# Patient Record
Sex: Male | Born: 1940 | Race: White | Hispanic: No | Marital: Married | Smoking: Never smoker
Health system: Southern US, Community
[De-identification: ages and names within clinical notes are randomized; demographics above are authoritative.]

## PROBLEM LIST (undated history)

## (undated) DIAGNOSIS — C61 Malignant neoplasm of prostate: Secondary | ICD-10-CM

## (undated) DIAGNOSIS — E785 Hyperlipidemia, unspecified: Secondary | ICD-10-CM

## (undated) DIAGNOSIS — I2510 Atherosclerotic heart disease of native coronary artery without angina pectoris: Secondary | ICD-10-CM

## (undated) DIAGNOSIS — R1013 Epigastric pain: Secondary | ICD-10-CM

## (undated) DIAGNOSIS — I10 Essential (primary) hypertension: Secondary | ICD-10-CM

## (undated) DIAGNOSIS — I350 Nonrheumatic aortic (valve) stenosis: Secondary | ICD-10-CM

## (undated) DIAGNOSIS — M1990 Unspecified osteoarthritis, unspecified site: Secondary | ICD-10-CM

## (undated) DIAGNOSIS — K219 Gastro-esophageal reflux disease without esophagitis: Secondary | ICD-10-CM

## (undated) HISTORY — DX: Malignant neoplasm of prostate: C61

## (undated) HISTORY — PX: TRIGGER FINGER RELEASE: SHX641

## (undated) HISTORY — PX: PARATHYROIDECTOMY: SHX19

## (undated) HISTORY — DX: Gastro-esophageal reflux disease without esophagitis: K21.9

## (undated) HISTORY — DX: Hyperlipidemia, unspecified: E78.5

## (undated) HISTORY — DX: Essential (primary) hypertension: I10

## (undated) HISTORY — DX: Nonrheumatic aortic (valve) stenosis: I35.0

## (undated) HISTORY — DX: Epigastric pain: R10.13

## (undated) HISTORY — DX: Atherosclerotic heart disease of native coronary artery without angina pectoris: I25.10

## (undated) HISTORY — PX: OTHER SURGICAL HISTORY: SHX169

## (undated) HISTORY — PX: PROSTATE SURGERY: SHX751

## (undated) HISTORY — DX: Unspecified osteoarthritis, unspecified site: M19.90

## (undated) HISTORY — PX: TONSILLECTOMY: SUR1361

---

## 2011-08-13 ENCOUNTER — Encounter: Payer: Self-pay | Admitting: *Deleted

## 2011-08-14 ENCOUNTER — Ambulatory Visit (INDEPENDENT_AMBULATORY_CARE_PROVIDER_SITE_OTHER): Payer: Medicare Other | Admitting: Cardiology

## 2011-08-14 ENCOUNTER — Encounter: Payer: Self-pay | Admitting: Cardiology

## 2011-08-14 DIAGNOSIS — I10 Essential (primary) hypertension: Secondary | ICD-10-CM

## 2011-08-14 DIAGNOSIS — E785 Hyperlipidemia, unspecified: Secondary | ICD-10-CM | POA: Insufficient documentation

## 2011-08-14 DIAGNOSIS — R011 Cardiac murmur, unspecified: Secondary | ICD-10-CM

## 2011-08-14 NOTE — Assessment & Plan Note (Signed)
Blood pressure controlled. Continue present medications. 

## 2011-08-14 NOTE — Progress Notes (Signed)
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 Patient previously lived in [place]. He exercises routinely riding a recumbent bike for up to one and one half hours. He does not have dyspnea on exertion, orthopnea, PND, pedal edema, exertional chest pain or syncope. He apparently had a cardiac CT some years ago which was unremarkable. He apparently had a nuclear study one to 2 years ago that was normal. Those records are available. He also has been noted to have a murmur and cardiology was asked to evaluate.  Current Outpatient Prescriptions  Medication Sig Dispense Refill  . acetaminophen (TYLENOL) 650 MG CR tablet Take 650 mg by mouth every 8 (eight) hours as needed.      Marland Kitchen amLODipine (NORVASC) 10 MG tablet Take 10 mg by mouth daily.      Marland Kitchen aspirin 81 MG tablet Take 160 mg by mouth daily.      Marland Kitchen atorvastatin (LIPITOR) 40 MG tablet Take 40 mg by mouth daily.      . celecoxib (CELEBREX) 200 MG capsule Take 200 mg by mouth daily as needed.      . Cholecalciferol 1000 UNITS capsule Take 1,000 Units by mouth daily.      Marland Kitchen HYDROcodone-acetaminophen (LORTAB) 10-500 MG per tablet Take 1 tablet by mouth every 6 (six) hours as needed.      Marland Kitchen omeprazole (PRILOSEC) 20 MG capsule Take 20 mg by mouth daily.      . valsartan (DIOVAN) 320 MG tablet Take 320 mg by mouth daily.      Marland Kitchen zolpidem (AMBIEN) 10 MG tablet Take 10 mg by mouth at bedtime as needed.        Allergies  Allergen Reactions  . Ace Inhibitors   . Adhesive (Tape)   . Zocor (Simvastatin)     Past Medical History  Diagnosis Date  . Hypertension   . Hyperlipidemia   . Recurrent prostate adenocarcinoma   . Arthritis   . Dyspepsia   . GERD (gastroesophageal reflux disease)     Past Surgical History  Procedure Date  . Prostate surgery   . Trigger finger release   . Parathyroidectomy   . Tonsillectomy   . Appendectomy     History   Social History  . Marital Status: Married      Spouse Name: N/A    Number of Children: 2  . Years of Education: N/A   Occupational History  .      Retired   Social History Main Topics  . Smoking status: Never Smoker   . Smokeless tobacco: Never Used  . Alcohol Use: Yes     Rarely  . Drug Use: Not on file  . Sexually Active: Not on file   Other Topics Concern  . Not on file   Social History Narrative  . No narrative on file    Family History  Problem Relation Age of Onset  . Coronary artery disease Father     age 64    ROS: Problems with back pain but no fevers or chills, productive cough, hemoptysis, dysphasia, odynophagia, melena, hematochezia, dysuria, hematuria, rash, seizure activity, orthopnea, PND, pedal edema, claudication. Remaining systems are negative.  Physical Exam:   Blood pressure 108/66, pulse 55, height 5\' 10"  (1.778 m), weight 194 lb (87.998 kg).  General:  Well developed/well nourished in NAD Skin warm/dry Patient not depressed No peripheral clubbing Back-normal HEENT-normal/normal eyelids Neck supple/normal carotid upstroke bilaterally; no bruits; no JVD; no thyromegaly chest - CTA/ normal  expansion CV - RRR/normal S1 and S2; no rubs or gallops;  PMI nondisplaced; 2/6 systolic ejection murmur Abdomen -NT/ND, no HSM, no mass, + bowel sounds, no bruit 2+ femoral pulses, no bruits Ext-no edema, chords, 2+ DP Neuro-grossly nonfocal  ECG sinus bradycardia with no ST changes.

## 2011-08-14 NOTE — Assessment & Plan Note (Signed)
Probable systolic ejection murmur. Obtain records from [facility] concerning previous cardiac CT and functional study. No further evaluation at this time.

## 2011-08-14 NOTE — Patient Instructions (Signed)
Your physician wants you to follow-up in: ONE YEAR You will receive a reminder letter in the mail two months in advance. If you don't receive a letter, please call our office to schedule the follow-up appointment.  

## 2011-08-14 NOTE — Assessment & Plan Note (Signed)
Continue statin. Lipids and liver monitored by primary care. 

## 2011-08-18 ENCOUNTER — Telehealth: Payer: Self-pay | Admitting: Cardiology

## 2011-08-18 NOTE — Telephone Encounter (Addendum)
ROI faxed to [facility]/Dr.Jason Mallory Shirk @ [phone] 08/18/11/KM  Records Received from  [facility] gave to [facility] 08/18/11/KM

## 2011-08-20 ENCOUNTER — Telehealth: Payer: Self-pay | Admitting: *Deleted

## 2011-08-20 DIAGNOSIS — Z01810 Encounter for preprocedural cardiovascular examination: Secondary | ICD-10-CM

## 2011-08-20 DIAGNOSIS — I77810 Thoracic aortic ectasia: Secondary | ICD-10-CM

## 2011-08-20 NOTE — Telephone Encounter (Signed)
Spoke with pt, CTA scheduled. He has a copy of labs from his primary dated 08-10-11 with bun 29 and cr 1.30. precert made aware.

## 2011-08-20 NOTE — Telephone Encounter (Signed)
Left message for pt to call, records from [facility], [place] reviewed by dr Jens Som and pt needs a follow up CTA of the chest to follow up dilated aortic root. He will also need a bmp for that procedure.

## 2011-08-21 ENCOUNTER — Ambulatory Visit: Payer: Self-pay | Admitting: Cardiology

## 2011-08-25 ENCOUNTER — Ambulatory Visit (INDEPENDENT_AMBULATORY_CARE_PROVIDER_SITE_OTHER)
Admission: RE | Admit: 2011-08-25 | Discharge: 2011-08-25 | Disposition: A | Discharge status: Home / Self Care | Payer: Medicare Other | Source: Ambulatory Visit | Attending: Cardiology | Admitting: Cardiology

## 2011-08-25 ENCOUNTER — Telehealth: Payer: Self-pay | Admitting: Cardiology

## 2011-08-25 DIAGNOSIS — I77819 Aortic ectasia, unspecified site: Secondary | ICD-10-CM

## 2011-08-25 DIAGNOSIS — I77810 Thoracic aortic ectasia: Secondary | ICD-10-CM

## 2011-08-25 MED ORDER — IOHEXOL 300 MG/ML  SOLN
80.0000 mL | Freq: Once | INTRAMUSCULAR | Status: AC | PRN
  Administered 2011-08-25: 80 mL via INTRAVENOUS

## 2011-08-25 NOTE — Telephone Encounter (Signed)
Fu call °Patient returning your call °

## 2011-08-25 NOTE — Telephone Encounter (Signed)
Spoke with pt, aware of CTA results. °

## 2012-08-16 DIAGNOSIS — M1990 Unspecified osteoarthritis, unspecified site: Secondary | ICD-10-CM | POA: Insufficient documentation

## 2012-08-16 DIAGNOSIS — K219 Gastro-esophageal reflux disease without esophagitis: Secondary | ICD-10-CM | POA: Insufficient documentation

## 2012-08-16 DIAGNOSIS — C61 Malignant neoplasm of prostate: Secondary | ICD-10-CM | POA: Insufficient documentation

## 2012-08-16 DIAGNOSIS — R1013 Epigastric pain: Secondary | ICD-10-CM | POA: Insufficient documentation

## 2012-08-17 ENCOUNTER — Ambulatory Visit (INDEPENDENT_AMBULATORY_CARE_PROVIDER_SITE_OTHER): Payer: Medicare Other | Admitting: Cardiology

## 2012-08-17 ENCOUNTER — Encounter: Payer: Self-pay | Admitting: Cardiology

## 2012-08-17 VITALS — BP 126/82 | HR 50 | Wt 187.0 lb

## 2012-08-17 DIAGNOSIS — I359 Nonrheumatic aortic valve disorder, unspecified: Secondary | ICD-10-CM

## 2012-08-17 DIAGNOSIS — E7800 Pure hypercholesterolemia, unspecified: Secondary | ICD-10-CM

## 2012-08-17 DIAGNOSIS — I719 Aortic aneurysm of unspecified site, without rupture: Secondary | ICD-10-CM | POA: Insufficient documentation

## 2012-08-17 DIAGNOSIS — I10 Essential (primary) hypertension: Secondary | ICD-10-CM

## 2012-08-17 DIAGNOSIS — I2510 Atherosclerotic heart disease of native coronary artery without angina pectoris: Secondary | ICD-10-CM

## 2012-08-17 DIAGNOSIS — E785 Hyperlipidemia, unspecified: Secondary | ICD-10-CM

## 2012-08-17 DIAGNOSIS — I350 Nonrheumatic aortic (valve) stenosis: Secondary | ICD-10-CM

## 2012-08-17 LAB — LIPID PANEL
Cholesterol: 166 mg/dL (ref 0–200)
HDL: 63.5 mg/dL (ref >39.00)
LDL Cholesterol: 82 mg/dL (ref 0–99)
Triglycerides: 103 mg/dL (ref 0.0–149.0)
VLDL: 20.6 mg/dL (ref 0.0–40.0)

## 2012-08-17 LAB — BASIC METABOLIC PANEL
BUN: 25 mg/dL — ABNORMAL HIGH (ref 6–23)
Chloride: 108 mEq/L (ref 96–112)
GFR: 56.96 mL/min — ABNORMAL LOW (ref >60.00)
Potassium: 4.1 mEq/L (ref 3.5–5.1)

## 2012-08-17 LAB — HEPATIC FUNCTION PANEL
ALT: 18 U/L (ref 0–53)
Albumin: 4.3 g/dL (ref 3.5–5.2)
Bilirubin, Direct: 0.1 mg/dL (ref 0.0–0.3)
Total Protein: 7.4 g/dL (ref 6.0–8.3)

## 2012-08-17 NOTE — Assessment & Plan Note (Signed)
Mild on examination. Plan followup echoes in the future.

## 2012-08-17 NOTE — Assessment & Plan Note (Signed)
Continue present medications. Check potassium and renal function. 

## 2012-08-17 NOTE — Progress Notes (Signed)
HPI: Pleasant male for followup of aortic stenosis. I originally saw him in January of 2013. Holter monitor in May of 2011 showed sinus rhythm with PACs and PVCs. Echocardiogram in May 2011 showed normal LV function, mild aortic stenosis (mean gradient 9 mmHg) and grade 2 diastolic dysfunction. Myoview in May 2011 showed normal LV function and no ischemia. CTA in January of 2013 showed a 4 cm occipital and ascending aorta dilatation and coronary calcification. Note CTA in 2008 showed aneurysmal dilatation of the ascending aorta of 4.2-4.3 cm. Calcium score significantly elevated. I last saw him in January of 2013. Since then the patient denies any dyspnea on exertion, orthopnea, PND, pedal edema, palpitations, syncope or chest pain.   Current Outpatient Prescriptions  Medication Sig Dispense Refill  . acetaminophen (TYLENOL) 650 MG CR tablet Take 650 mg by mouth every 8 (eight) hours as needed.      Marland Kitchen amLODipine (NORVASC) 10 MG tablet Take 10 mg by mouth daily.      Marland Kitchen aspirin 81 MG tablet Take 160 mg by mouth daily.      Marland Kitchen atorvastatin (LIPITOR) 40 MG tablet Take 40 mg by mouth daily.      . celecoxib (CELEBREX) 200 MG capsule Take 200 mg by mouth daily as needed.      . Coenzyme Q10 (CO Q 10 PO) Take 1 tablet by mouth daily.      Marland Kitchen HYDROcodone-acetaminophen (LORTAB) 10-500 MG per tablet Take 1 tablet by mouth every 6 (six) hours as needed.      . Multiple Vitamin (MULTIVITAMIN) capsule Take 1 capsule by mouth daily.      Marland Kitchen omeprazole (PRILOSEC) 20 MG capsule Take 20 mg by mouth daily.      . valsartan (DIOVAN) 320 MG tablet Take 320 mg by mouth daily.      Marland Kitchen zolpidem (AMBIEN) 10 MG tablet Take 10 mg by mouth at bedtime as needed.         Past Medical History  Diagnosis Date  . Hypertension   . Hyperlipidemia   . Malignant neoplasm of prostate   . Arthritis   . Dyspepsia   . GERD (gastroesophageal reflux disease)   . Aortic stenosis     Past Surgical History  Procedure Date  .  Prostate surgery   . Trigger finger release   . Parathyroidectomy   . Tonsillectomy   . Appendectomy     History   Social History  . Marital Status: Married    Spouse Name: N/A    Number of Children: 2  . Years of Education: N/A   Occupational History  .      Retired   Social History Main Topics  . Smoking status: Never Smoker   . Smokeless tobacco: Never Used  . Alcohol Use: Yes     Comment: Rarely  . Drug Use: Not on file  . Sexually Active: Not on file   Other Topics Concern  . Not on file   Social History Narrative  . No narrative on file    ROS: no fevers or chills, productive cough, hemoptysis, dysphasia, odynophagia, melena, hematochezia, dysuria, hematuria, rash, seizure activity, orthopnea, PND, pedal edema, claudication. Remaining systems are negative.  Physical Exam: Well-developed well-nourished in no acute distress.  Skin is warm and dry.  HEENT is normal.  Neck is supple.  Chest is clear to auscultation with normal expansion.  Cardiovascular exam is regular rate and rhythm. 2/6 systolic murmur left sternal border S2 is not  diminished. Abdominal exam nontender or distended. No masses palpated. Extremities show no edema. neuro grossly intact  ECG sinus rhythm at a rate of 50. No ST changes.

## 2012-08-17 NOTE — Assessment & Plan Note (Signed)
Patient has a mildly dilated aortic root. However in comparison from 2008 to 2013 there is no significant change. We will plan followup CTAs in the future.

## 2012-08-17 NOTE — Assessment & Plan Note (Signed)
Continue aspirin and statin. Schedule followup functional study for risk stratification.

## 2012-08-17 NOTE — Patient Instructions (Addendum)

## 2012-08-17 NOTE — Assessment & Plan Note (Signed)
Continue statin. Check lipids and liver. 

## 2012-08-18 ENCOUNTER — Encounter (HOSPITAL_COMMUNITY): Payer: Medicare Other

## 2012-08-24 ENCOUNTER — Encounter (HOSPITAL_COMMUNITY): Payer: Medicare Other

## 2012-08-30 ENCOUNTER — Ambulatory Visit (HOSPITAL_COMMUNITY): Payer: Medicare Other | Attending: Cardiology | Admitting: Radiology

## 2012-08-30 VITALS — BP 147/85 | Ht 70.0 in | Wt 187.0 lb

## 2012-08-30 DIAGNOSIS — J45909 Unspecified asthma, uncomplicated: Secondary | ICD-10-CM | POA: Insufficient documentation

## 2012-08-30 DIAGNOSIS — I10 Essential (primary) hypertension: Secondary | ICD-10-CM

## 2012-08-30 DIAGNOSIS — I2510 Atherosclerotic heart disease of native coronary artery without angina pectoris: Secondary | ICD-10-CM

## 2012-08-30 DIAGNOSIS — R002 Palpitations: Secondary | ICD-10-CM | POA: Insufficient documentation

## 2012-08-30 DIAGNOSIS — R0609 Other forms of dyspnea: Secondary | ICD-10-CM | POA: Insufficient documentation

## 2012-08-30 DIAGNOSIS — R0989 Other specified symptoms and signs involving the circulatory and respiratory systems: Secondary | ICD-10-CM

## 2012-08-30 MED ORDER — TECHNETIUM TC 99M SESTAMIBI GENERIC - CARDIOLITE
30.0000 | Freq: Once | INTRAVENOUS | Status: AC | PRN
  Administered 2012-08-30: 30 via INTRAVENOUS

## 2012-08-30 MED ORDER — TECHNETIUM TC 99M SESTAMIBI GENERIC - CARDIOLITE
10.0000 | Freq: Once | INTRAVENOUS | Status: AC | PRN
  Administered 2012-08-30: 10 via INTRAVENOUS

## 2012-08-30 NOTE — Progress Notes (Signed)
[facility] SITE 3 NUCLEAR MED [address] [place], [place] [address] [phone]    Cardiology Nuclear Med Study  Paul Wheeler is a 72 y.o. male     MRN : [id number]     DOB: 03/24/1941  Procedure Date: 08/30/2012  Nuclear Med Background Indication for Stress Test:  Evaluation for Ischemia History:  Asthma and 2008 Cardia CTA Ca score 1262, MPS: EF: 64% (-) ischemia in [place] '11 ECHO: EF: 54% mild AS '13: CT: Coronary Calicification Cardiac Risk Factors: Family History - CAD, Hypertension and Lipids  Symptoms:  DOE, Fatigue with Exertion and Palpitations   Nuclear Pre-Procedure Caffeine/Decaff Intake:  None> 12 hrs NPO After: 7:00pm   Lungs:  clear O2 Sat: 98% on room air. IV 0.9% NS with Angio Cath:  20g  IV Site: L Antecubital x 1, tolerated well IV Started by:  Irean Hong, RN  Chest Size (in):  42 Cup Size: n/a  Height: 5\' 10"  (1.778 m)  Weight:  187 lb (84.823 kg)  BMI:  Body mass index is 26.83 kg/(m^2). Tech Comments:  Only medication today was 2 tylenol tablets @ 6:00am per patient    Nuclear Med Study 1 or 2 day study: 1 day  Stress Test Type:  Stress  Reading MD: Olga Millers, MD  Order Authorizing Provider:  Olga Millers, MD  Resting Radionuclide: Technetium 38m Sestamibi  Resting Radionuclide Dose: 11.0 mCi   Stress Radionuclide:  Technetium 60m Sestamibi  Stress Radionuclide Dose: 33.0 mCi           Stress Protocol Rest HR: 54 Stress HR: 136  Rest BP: 147/85 Stress BP: 180/92  Exercise Time (min): 9:00 METS: 10.10   Predicted Max HR: 147 bpm % Max HR: 92.52 bpm Rate Pressure Product: 21308    Dose of Adenosine (mg):  n/a Dose of Lexiscan: n/a mg  Dose of Atropine (mg): n/a Dose of Dobutamine: n/a mcg/kg/min (at max HR)  Stress Test Technologist: Milana Na, EMT-P  Nuclear Technologist:  Domenic Polite, CNMT     Rest Procedure:  Myocardial perfusion imaging was performed at rest 45 minutes following the intravenous  administration of Technetium 51m Sestamibi. Rest ECG: NSR - Normal EKG  Stress Procedure:  The patient exercised on the treadmill utilizing the Bruce Protocol for 9:00 minutes. The patient stopped due to fatigue and occ pvcs and rare pacs.  Technetium 86m Sestamibi was injected at peak exercise and myocardial perfusion imaging was performed after a brief delay. Stress ECG: No significant ST segment change suggestive of ischemia.  QPS Raw Data Images:  Acquisition technically good; normal left ventricular size. Stress Images:  Normal homogeneous uptake in all areas of the myocardium. Rest Images:  Normal homogeneous uptake in all areas of the myocardium. Subtraction (SDS):  No evidence of ischemia. Transient Ischemic Dilatation (Normal <1.22):  0.98 Lung/Heart Ratio (Normal <0.45):  0.31  Quantitative Gated Spect Images QGS EDV:  96 ml QGS ESV:  28 ml  Impression Exercise Capacity:  Good exercise capacity. BP Response:  Normal blood pressure response. Clinical Symptoms:  No chest pain or dyspnea. ECG Impression:  No significant ST segment change suggestive of ischemia. Comparison with Prior Nuclear Study: No images to compare  Overall Impression:  Normal stress nuclear study.  LV Ejection Fraction: 71%.  LV Wall Motion:  NL LV Function; NL Wall Motion   Olga Millers

## 2012-09-02 ENCOUNTER — Other Ambulatory Visit: Payer: Self-pay | Admitting: *Deleted

## 2012-09-02 DIAGNOSIS — E7800 Pure hypercholesterolemia, unspecified: Secondary | ICD-10-CM

## 2012-09-02 MED ORDER — ATORVASTATIN CALCIUM 80 MG PO TABS: 80.0000 mg | ORAL_TABLET | Freq: Every day | ORAL | Status: AC

## 2012-09-10 ENCOUNTER — Other Ambulatory Visit: Payer: Self-pay

## 2012-10-13 ENCOUNTER — Other Ambulatory Visit (INDEPENDENT_AMBULATORY_CARE_PROVIDER_SITE_OTHER): Payer: Medicare Other

## 2012-10-13 DIAGNOSIS — E7800 Pure hypercholesterolemia, unspecified: Secondary | ICD-10-CM

## 2012-10-13 LAB — HEPATIC FUNCTION PANEL
Albumin: 4.1 g/dL (ref 3.5–5.2)
Alkaline Phosphatase: 58 U/L (ref 39–117)
Total Protein: 7 g/dL (ref 6.0–8.3)

## 2012-10-13 LAB — LIPID PANEL
Cholesterol: 149 mg/dL (ref 0–200)
HDL: 64.7 mg/dL (ref >39.00)
Triglycerides: 86 mg/dL (ref 0.0–149.0)

## 2012-10-17 ENCOUNTER — Telehealth: Payer: Self-pay | Admitting: Cardiology

## 2012-10-17 NOTE — Telephone Encounter (Signed)
LMTCB

## 2012-10-17 NOTE — Telephone Encounter (Signed)
Spoke with patient who saw his labs on MyChart.  Patient verbalized understanding that Dr. Jens Som did not recommend any changes at this time.

## 2012-10-17 NOTE — Telephone Encounter (Signed)
New Prob   Pt calling in returning phone call in regards to lab results.

## 2012-10-19 ENCOUNTER — Encounter: Payer: Self-pay | Admitting: *Deleted

## 2013-03-01 ENCOUNTER — Other Ambulatory Visit: Payer: Self-pay

## 2013-06-01 ENCOUNTER — Other Ambulatory Visit: Payer: Self-pay

## 2013-06-05 ENCOUNTER — Encounter: Payer: Self-pay | Admitting: *Deleted

## 2013-06-05 ENCOUNTER — Telehealth: Payer: Self-pay | Admitting: Cardiology

## 2013-06-05 NOTE — Telephone Encounter (Signed)
Spoke with pt, according to the system he is already active. He was given the number to call for the help desk.

## 2013-06-05 NOTE — Telephone Encounter (Signed)
New message    Can't get on mychart. Need someone to call him.

## 2013-08-21 ENCOUNTER — Encounter: Payer: Self-pay | Admitting: Cardiology

## 2013-08-21 ENCOUNTER — Ambulatory Visit (INDEPENDENT_AMBULATORY_CARE_PROVIDER_SITE_OTHER): Payer: Medicare Other | Admitting: Cardiology

## 2013-08-21 VITALS — BP 142/89 | HR 57 | Ht 70.0 in | Wt 180.0 lb

## 2013-08-21 DIAGNOSIS — IMO0001 Reserved for inherently not codable concepts without codable children: Secondary | ICD-10-CM

## 2013-08-21 DIAGNOSIS — I712 Thoracic aortic aneurysm, without rupture, unspecified: Secondary | ICD-10-CM

## 2013-08-21 DIAGNOSIS — I2510 Atherosclerotic heart disease of native coronary artery without angina pectoris: Secondary | ICD-10-CM

## 2013-08-21 LAB — CBC WITH DIFFERENTIAL/PLATELET
BASOS PCT: 1.1 % (ref 0.0–3.0)
Basophils Absolute: 0.1 10*3/uL (ref 0.0–0.1)
EOS PCT: 1.8 % (ref 0.0–5.0)
Eosinophils Absolute: 0.1 10*3/uL (ref 0.0–0.7)
HCT: 41.2 % (ref 39.0–52.0)
Hemoglobin: 13.7 g/dL (ref 13.0–17.0)
LYMPHS PCT: 31 % (ref 12.0–46.0)
Lymphs Abs: 1.5 10*3/uL (ref 0.7–4.0)
MCHC: 33.4 g/dL (ref 30.0–36.0)
MCV: 89.9 fl (ref 78.0–100.0)
MONOS PCT: 11.1 % (ref 3.0–12.0)
Monocytes Absolute: 0.5 10*3/uL (ref 0.1–1.0)
NEUTROS PCT: 55 % (ref 43.0–77.0)
Neutro Abs: 2.7 10*3/uL (ref 1.4–7.7)
Platelets: 209 10*3/uL (ref 150.0–400.0)
RBC: 4.58 Mil/uL (ref 4.22–5.81)
RDW: 14.2 % (ref 11.5–14.6)
WBC: 4.8 10*3/uL (ref 4.5–10.5)

## 2013-08-21 LAB — LIPID PANEL
CHOL/HDL RATIO: 3
Cholesterol: 165 mg/dL (ref 0–200)
HDL: 65.5 mg/dL (ref >39.00)
LDL CALC: 82 mg/dL (ref 0–99)
TRIGLYCERIDES: 89 mg/dL (ref 0.0–149.0)
VLDL: 17.8 mg/dL (ref 0.0–40.0)

## 2013-08-21 LAB — BASIC METABOLIC PANEL
BUN: 22 mg/dL (ref 6–23)
CHLORIDE: 106 meq/L (ref 96–112)
CO2: 27 mEq/L (ref 19–32)
CREATININE: 1.2 mg/dL (ref 0.4–1.5)
Calcium: 9.4 mg/dL (ref 8.4–10.5)
GFR: 61.08 mL/min (ref >60.00)
Glucose, Bld: 96 mg/dL (ref 70–99)
POTASSIUM: 3.8 meq/L (ref 3.5–5.1)
Sodium: 141 mEq/L (ref 135–145)

## 2013-08-21 LAB — HEPATIC FUNCTION PANEL
ALBUMIN: 4.2 g/dL (ref 3.5–5.2)
ALT: 18 U/L (ref 0–53)
AST: 23 U/L (ref 0–37)
Alkaline Phosphatase: 66 U/L (ref 39–117)
Bilirubin, Direct: 0.1 mg/dL (ref 0.0–0.3)
Total Bilirubin: 0.9 mg/dL (ref 0.3–1.2)
Total Protein: 7.3 g/dL (ref 6.0–8.3)

## 2013-08-21 NOTE — Assessment & Plan Note (Signed)
Continue statin. Check lipids and liver. 

## 2013-08-21 NOTE — Assessment & Plan Note (Signed)
Blood pressure controlled. Continue present medications. Check potassium and renal function. 

## 2013-08-21 NOTE — Progress Notes (Signed)
HPI: FU aortic stenosis. I originally saw him in January of 2013. Holter monitor in May of 2011 showed sinus rhythm with PACs and PVCs. Echocardiogram in May 2011 showed normal LV function, mild aortic stenosis (mean gradient 9 mmHg) and grade 2 diastolic dysfunction. CTA in January of 2013 showed 4 cm ascending aorta dilatation and coronary calcification. Note CTA in 2008 showed aneurysmal dilatation of the ascending aorta of 4.2-4.3 cm. Calcium score significantly elevated. Nuclear study in February of 2014 showed an ejection fraction of 71% and normal perfusion. I last saw him in January of 2014. Since then the patient denies any dyspnea on exertion, orthopnea, PND, pedal edema, palpitations, syncope or chest pain.   Current Outpatient Prescriptions  Medication Sig Dispense Refill  . acetaminophen (TYLENOL) 650 MG CR tablet Take 650 mg by mouth every 8 (eight) hours as needed.      Marland Kitchen amLODipine-valsartan (EXFORGE) 10-320 MG per tablet Take 1 tablet by mouth daily.      Marland Kitchen atorvastatin (LIPITOR) 80 MG tablet Take 1 tablet (80 mg total) by mouth daily.  90 tablet  3  . Coenzyme Q10 (CO Q 10 PO) Take 1 tablet by mouth daily.      . Multiple Vitamin (MULTIVITAMIN) capsule Take 1 capsule by mouth daily.      Marland Kitchen omeprazole (PRILOSEC) 20 MG capsule Take 20 mg by mouth daily.      Marland Kitchen amLODipine (NORVASC) 10 MG tablet Take 10 mg by mouth daily.      Marland Kitchen aspirin 81 MG tablet Take 160 mg by mouth daily.      . celecoxib (CELEBREX) 200 MG capsule Take 200 mg by mouth daily as needed.      Marland Kitchen HYDROcodone-acetaminophen (LORTAB) 10-500 MG per tablet Take 1 tablet by mouth every 6 (six) hours as needed.      . valsartan (DIOVAN) 320 MG tablet Take 320 mg by mouth daily.      Marland Kitchen zolpidem (AMBIEN) 10 MG tablet Take 10 mg by mouth at bedtime as needed.       No current facility-administered medications for this visit.     Past Medical History  Diagnosis Date  . Hypertension   . Hyperlipidemia   .  Malignant neoplasm of prostate   . Arthritis   . Dyspepsia   . GERD (gastroesophageal reflux disease)   . Aortic stenosis     Past Surgical History  Procedure Laterality Date  . Prostate surgery    . Trigger finger release    . Parathyroidectomy    . Tonsillectomy    . Appendectomy      History   Social History  . Marital Status: Married    Spouse Name: N/A    Number of Children: 2  . Years of Education: N/A   Occupational History  .      Retired   Social History Main Topics  . Smoking status: Never Smoker   . Smokeless tobacco: Never Used  . Alcohol Use: Yes     Comment: Rarely  . Drug Use: Not on file  . Sexual Activity: Not on file   Other Topics Concern  . Not on file   Social History Narrative  . No narrative on file    ROS: no fevers or chills, productive cough, hemoptysis, dysphasia, odynophagia, melena, hematochezia, dysuria, hematuria, rash, seizure activity, orthopnea, PND, pedal edema, claudication. Remaining systems are negative.  Physical Exam: Well-developed well-nourished in no acute distress.  Skin is  warm and dry.  HEENT is normal.  Neck is supple.  Chest is clear to auscultation with normal expansion.  Cardiovascular exam is regular rate and rhythm.  Abdominal exam nontender or distended. No masses palpated. Extremities show no edema. neuro grossly intact  ECG sinus bradycardia with no ST changes.

## 2013-08-21 NOTE — Assessment & Plan Note (Signed)
Plan repeat CTA of thoracic aorta.

## 2013-08-21 NOTE — Patient Instructions (Signed)
Your physician wants you to follow-up in: [place] will receive a reminder letter in the mail two months in advance. If you don't receive a letter, please call our office to schedule the follow-up appointment.   Your physician recommends that you HAVE LAB WORK TODAY  CTA OF CHEST W AND W/O TO FOLLOW UP THORACIC ANEURYSM

## 2013-08-21 NOTE — Assessment & Plan Note (Signed)
Continue aspirin and statin. 

## 2013-08-21 NOTE — Assessment & Plan Note (Signed)
Remains mild on examination.followup echoes in the future.

## 2013-08-28 ENCOUNTER — Ambulatory Visit (INDEPENDENT_AMBULATORY_CARE_PROVIDER_SITE_OTHER)
Admission: RE | Admit: 2013-08-28 | Discharge: 2013-08-28 | Disposition: A | Discharge status: Home / Self Care | Payer: Medicare Other | Source: Ambulatory Visit | Attending: Cardiology | Admitting: Cardiology

## 2013-08-28 DIAGNOSIS — IMO0001 Reserved for inherently not codable concepts without codable children: Secondary | ICD-10-CM

## 2013-08-28 DIAGNOSIS — I712 Thoracic aortic aneurysm, without rupture, unspecified: Secondary | ICD-10-CM

## 2013-08-28 DIAGNOSIS — I2510 Atherosclerotic heart disease of native coronary artery without angina pectoris: Secondary | ICD-10-CM

## 2013-08-28 MED ORDER — IOHEXOL 350 MG/ML SOLN
100.0000 mL | Freq: Once | INTRAVENOUS | Status: AC | PRN
  Administered 2013-08-28: 100 mL via INTRAVENOUS

## 2014-08-20 NOTE — Progress Notes (Signed)
      HPI: FU aortic stenosis. 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 Note Calcium score significantly elevated. Holter monitor in May of 2011 showed sinus rhythm with PACs and PVCs. Echocardiogram in May 2011 showed normal LV function, mild aortic stenosis (mean gradient 9 mmHg) and grade 2 diastolic dysfunction. Nuclear study in February of 2014 showed an ejection fraction of 71% and normal perfusion. CTA February 2015 showed stable ascending aortic aneurysm measuring 4.4 cm. Since I last saw him, the patient denies any dyspnea on exertion, orthopnea, PND, pedal edema, palpitations, syncope or chest pain. Occasional mild dizziness with ambulation.   Current Outpatient Prescriptions  Medication Sig Dispense Refill  . acetaminophen (TYLENOL) 650 MG CR tablet Take 650 mg by mouth every 8 (eight) hours as needed.    Marland Kitchen amLODipine-valsartan (EXFORGE) 10-320 MG per tablet Take 1 tablet by mouth daily.    Marland Kitchen aspirin 81 MG tablet Take 160 mg by mouth daily.    Marland Kitchen atorvastatin (LIPITOR) 80 MG tablet Take 1 tablet (80 mg total) by mouth daily. 90 tablet 3  . celecoxib (CELEBREX) 200 MG capsule Take 200 mg by mouth daily as needed.    . Coenzyme Q10 (CO Q 10 PO) Take 1 tablet by mouth daily.    . finasteride (PROSCAR) 5 MG tablet Take 5 mg by mouth daily.    . Multiple Vitamin (MULTIVITAMIN) capsule Take 1 capsule by mouth daily.    Marland Kitchen omeprazole (PRILOSEC) 20 MG capsule Take 20 mg by mouth daily.    Marland Kitchen zolpidem (AMBIEN) 10 MG tablet Take 10 mg by mouth at bedtime as needed.     No current facility-administered medications for this visit.     Past Medical History  Diagnosis Date  . Hypertension   . Hyperlipidemia   . Malignant neoplasm of prostate   . Arthritis   . Dyspepsia   . GERD (gastroesophageal reflux disease)   . Aortic stenosis     Past Surgical History  Procedure Laterality Date  . Prostate surgery    . Trigger finger release    .  Parathyroidectomy    . Tonsillectomy    . Appendectomy      History   Social History  . Marital Status: Married    Spouse Name: N/A    Number of Children: 2  . Years of Education: N/A   Occupational History  .      Retired   Social History Main Topics  . Smoking status: Never Smoker   . Smokeless tobacco: Never Used  . Alcohol Use: Yes     Comment: Rarely  . Drug Use: Not on file  . Sexual Activity: Not on file   Other Topics Concern  . Not on file   Social History Narrative    ROS: no fevers or chills, productive cough, hemoptysis, dysphasia, odynophagia, melena, hematochezia, dysuria, hematuria, rash, seizure activity, orthopnea, PND, pedal edema, claudication. Remaining systems are negative.  Physical Exam: Well-developed well-nourished in no acute distress.  Skin is warm and dry.  HEENT is normal.  Neck is supple.  Chest is clear to auscultation with normal expansion.  Cardiovascular exam is regular rate and rhythm. 2/6 systolic murmur left sternal border. Abdominal exam nontender or distended. No masses palpated. Extremities show no edema. neuro grossly intact  ECG sinus bradycardia with occasional PVC. No ST changes.

## 2014-08-24 ENCOUNTER — Ambulatory Visit (INDEPENDENT_AMBULATORY_CARE_PROVIDER_SITE_OTHER): Payer: Medicare Other | Admitting: Cardiology

## 2014-08-24 ENCOUNTER — Encounter: Payer: Self-pay | Admitting: Cardiology

## 2014-08-24 ENCOUNTER — Encounter: Payer: Self-pay | Admitting: *Deleted

## 2014-08-24 DIAGNOSIS — I2510 Atherosclerotic heart disease of native coronary artery without angina pectoris: Secondary | ICD-10-CM

## 2014-08-24 DIAGNOSIS — E785 Hyperlipidemia, unspecified: Secondary | ICD-10-CM

## 2014-08-24 DIAGNOSIS — I2583 Coronary atherosclerosis due to lipid rich plaque: Secondary | ICD-10-CM

## 2014-08-24 DIAGNOSIS — I719 Aortic aneurysm of unspecified site, without rupture: Secondary | ICD-10-CM

## 2014-08-24 DIAGNOSIS — I350 Nonrheumatic aortic (valve) stenosis: Secondary | ICD-10-CM

## 2014-08-24 LAB — HEPATIC FUNCTION PANEL
ALK PHOS: 71 U/L (ref 39–117)
ALT: 16 U/L (ref 0–53)
AST: 23 U/L (ref 0–37)
Albumin: 4.2 g/dL (ref 3.5–5.2)
BILIRUBIN TOTAL: 0.7 mg/dL (ref 0.2–1.2)
Bilirubin, Direct: 0.2 mg/dL (ref 0.0–0.3)
Indirect Bilirubin: 0.5 mg/dL (ref 0.2–1.2)
TOTAL PROTEIN: 6.5 g/dL (ref 6.0–8.3)

## 2014-08-24 LAB — BASIC METABOLIC PANEL WITH GFR
BUN: 22 mg/dL (ref 6–23)
CALCIUM: 9.3 mg/dL (ref 8.4–10.5)
CO2: 23 mEq/L (ref 19–32)
Chloride: 105 mEq/L (ref 96–112)
Creat: 1.19 mg/dL (ref 0.50–1.35)
GFR, EST AFRICAN AMERICAN: 69 mL/min
GFR, EST NON AFRICAN AMERICAN: 59 mL/min — AB
Glucose, Bld: 88 mg/dL (ref 70–99)
Potassium: 4.5 mEq/L (ref 3.5–5.3)
SODIUM: 142 meq/L (ref 135–145)

## 2014-08-24 LAB — LIPID PANEL
CHOL/HDL RATIO: 1.9 ratio
CHOLESTEROL: 145 mg/dL (ref 0–200)
HDL: 75 mg/dL (ref >39)
LDL CALC: 57 mg/dL (ref 0–99)
Triglycerides: 67 mg/dL (ref <150)
VLDL: 13 mg/dL (ref 0–40)

## 2014-08-24 NOTE — Assessment & Plan Note (Signed)
Plan repeat CTA.

## 2014-08-24 NOTE — Assessment & Plan Note (Signed)
Continue statin. Check lipids and liver. 

## 2014-08-24 NOTE — Assessment & Plan Note (Signed)
Plan repeat echocardiogram. 

## 2014-08-24 NOTE — Assessment & Plan Note (Addendum)
Continue present blood pressure medications. Check potassium and renal function. He is concerned the amlodipine may be contributing to gum disease. If he has worsening problems in the future we will consider changing and would consider hydralazine and low-dose diuretic.

## 2014-08-24 NOTE — Patient Instructions (Signed)
Your physician wants you to follow-up in: [place] will receive a reminder letter in the mail two months in advance. If you don't receive a letter, please call our office to schedule the follow-up appointment.   CTA OF CHEST WITH and W/O TO FOLLOW UP THORACIC ANEURYSM  Your physician has requested that you have an echocardiogram. Echocardiography is a painless test that uses sound waves to create images of your heart. It provides your doctor with information about the size and shape of your heart and how well your heart's chambers and valves are working. This procedure takes approximately one hour. There are no restrictions for this procedure.   Your physician recommends that you HAVE LAB WORK TODAY

## 2014-08-24 NOTE — Assessment & Plan Note (Signed)
Continue aspirin and statin. 

## 2014-08-29 ENCOUNTER — Ambulatory Visit (INDEPENDENT_AMBULATORY_CARE_PROVIDER_SITE_OTHER)
Admission: RE | Admit: 2014-08-29 | Discharge: 2014-08-29 | Disposition: A | Discharge status: Home / Self Care | Payer: Medicare Other | Source: Ambulatory Visit | Attending: Cardiology | Admitting: Cardiology

## 2014-08-29 DIAGNOSIS — I719 Aortic aneurysm of unspecified site, without rupture: Secondary | ICD-10-CM

## 2014-08-29 MED ORDER — IOHEXOL 350 MG/ML SOLN
100.0000 mL | Freq: Once | INTRAVENOUS | Status: AC | PRN
  Administered 2014-08-29: 100 mL via INTRAVENOUS

## 2014-09-12 ENCOUNTER — Ambulatory Visit (HOSPITAL_COMMUNITY)
Admission: RE | Admit: 2014-09-12 | Discharge: 2014-09-12 | Disposition: A | Discharge status: Home / Self Care | Payer: Medicare Other | Source: Ambulatory Visit | Attending: Cardiovascular Disease | Admitting: Cardiovascular Disease

## 2014-09-12 DIAGNOSIS — I350 Nonrheumatic aortic (valve) stenosis: Secondary | ICD-10-CM | POA: Diagnosis not present

## 2014-09-12 NOTE — Progress Notes (Signed)
2D Echocardiogram Complete.  09/12/2014   Paul Wheeler [place], [place]

## 2015-09-11 ENCOUNTER — Telehealth: Payer: Self-pay | Admitting: *Deleted

## 2015-09-11 DIAGNOSIS — I712 Thoracic aortic aneurysm, without rupture, unspecified: Secondary | ICD-10-CM

## 2015-09-11 NOTE — Telephone Encounter (Signed)
Left message for pt, it is time to recheck a CT of the chest to f/u his thoracic aneurysm. He has an appt to see dr Stanford Breed on 09-23-15. Orders placed for CT scan and sent to admin pool for scheduling.

## 2015-09-12 ENCOUNTER — Other Ambulatory Visit: Payer: Medicare Other

## 2015-09-12 ENCOUNTER — Other Ambulatory Visit: Payer: Self-pay

## 2015-09-12 DIAGNOSIS — I2510 Atherosclerotic heart disease of native coronary artery without angina pectoris: Secondary | ICD-10-CM

## 2015-09-12 DIAGNOSIS — I2583 Coronary atherosclerosis due to lipid rich plaque: Principal | ICD-10-CM

## 2015-09-13 ENCOUNTER — Other Ambulatory Visit (INDEPENDENT_AMBULATORY_CARE_PROVIDER_SITE_OTHER): Payer: Medicare Other | Admitting: *Deleted

## 2015-09-13 DIAGNOSIS — I2583 Coronary atherosclerosis due to lipid rich plaque: Principal | ICD-10-CM

## 2015-09-13 DIAGNOSIS — I2510 Atherosclerotic heart disease of native coronary artery without angina pectoris: Secondary | ICD-10-CM | POA: Diagnosis not present

## 2015-09-13 LAB — BASIC METABOLIC PANEL
BUN: 24 mg/dL (ref 7–25)
CHLORIDE: 107 mmol/L (ref 98–110)
CO2: 22 mmol/L (ref 20–31)
Calcium: 9.3 mg/dL (ref 8.6–10.3)
Creat: 1.35 mg/dL — ABNORMAL HIGH (ref 0.70–1.18)
GLUCOSE: 100 mg/dL — AB (ref 65–99)
POTASSIUM: 4.2 mmol/L (ref 3.5–5.3)
Sodium: 140 mmol/L (ref 135–146)

## 2015-09-17 ENCOUNTER — Ambulatory Visit (INDEPENDENT_AMBULATORY_CARE_PROVIDER_SITE_OTHER)
Admission: RE | Admit: 2015-09-17 | Discharge: 2015-09-17 | Disposition: A | Discharge status: Home / Self Care | Payer: Medicare Other | Source: Ambulatory Visit | Attending: Cardiology | Admitting: Cardiology

## 2015-09-17 DIAGNOSIS — I712 Thoracic aortic aneurysm, without rupture, unspecified: Secondary | ICD-10-CM

## 2015-09-17 MED ORDER — IOHEXOL 350 MG/ML SOLN
100.0000 mL | Freq: Once | INTRAVENOUS | Status: AC | PRN
  Administered 2015-09-17: 100 mL via INTRAVENOUS

## 2015-09-19 NOTE — Progress Notes (Signed)
HPI: FU aortic stenosis. 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 Note Calcium score significantly elevated. Holter monitor in May of 2011 showed sinus rhythm with PACs and PVCs. Nuclear study in February of 2014 showed an ejection fraction of 71% and normal perfusion. Last echocardiogram February 2016 showed normal LV function, grade 1 diastolic dysfunction, sclerotic aortic valve with mild aortic insufficiency. Follow-up CTA February 2017 showed aneurysmal dilatation of the ascending aorta at 4.3 cm. Since I last saw him, the patient denies any dyspnea on exertion, orthopnea, PND, pedal edema, palpitations, syncope or chest pain.   Current Outpatient Prescriptions  Medication Sig Dispense Refill  . acetaminophen (TYLENOL) 650 MG CR tablet Take 650 mg by mouth every 8 (eight) hours as needed.    Marland Kitchen amLODipine-valsartan (EXFORGE) 10-320 MG per tablet Take 1 tablet by mouth daily.    Marland Kitchen aspirin 81 MG tablet Take 160 mg by mouth daily.    Marland Kitchen atorvastatin (LIPITOR) 80 MG tablet Take 1 tablet (80 mg total) by mouth daily. 90 tablet 3  . celecoxib (CELEBREX) 200 MG capsule Take 200 mg by mouth daily as needed.    . Coenzyme Q10 (CO Q 10 PO) Take 1 tablet by mouth daily.    . finasteride (PROSCAR) 5 MG tablet Take 5 mg by mouth daily.    . Multiple Vitamin (MULTIVITAMIN) capsule Take 1 capsule by mouth daily.    Marland Kitchen omeprazole (PRILOSEC) 20 MG capsule Take 20 mg by mouth daily.    Marland Kitchen zolpidem (AMBIEN) 10 MG tablet Take 10 mg by mouth at bedtime as needed.     No current facility-administered medications for this visit.     Past Medical History  Diagnosis Date  . Hypertension   . Hyperlipidemia   . Malignant neoplasm of prostate ([place])   . Arthritis   . Dyspepsia   . GERD (gastroesophageal reflux disease)   . Aortic stenosis   . CAD (coronary artery disease)     Past Surgical History  Procedure Laterality Date  . Prostate surgery    . Trigger  finger release    . Parathyroidectomy    . Tonsillectomy    . Appendectomy      Social History   Social History  . Marital Status: Married    Spouse Name: N/A  . Number of Children: 2  . Years of Education: N/A   Occupational History  .      Retired   Social History Main Topics  . Smoking status: Never Smoker   . Smokeless tobacco: Never Used  . Alcohol Use: Yes     Comment: Rarely  . Drug Use: Not on file  . Sexual Activity: Not on file   Other Topics Concern  . Not on file   Social History Narrative    Family History  Problem Relation Age of Onset  . Coronary artery disease Father     age 68    ROS: no fevers or chills, productive cough, hemoptysis, dysphasia, odynophagia, melena, hematochezia, dysuria, hematuria, rash, seizure activity, orthopnea, PND, pedal edema, claudication. Remaining systems are negative.  Physical Exam: Well-developed well-nourished in no acute distress.  Skin is warm and dry.  HEENT is normal.  Neck is supple.  Chest is clear to auscultation with normal expansion.  Cardiovascular exam is regular rate and rhythm. 2/6 systolic murmur left sternal border. S2 is not diminished. Abdominal exam nontender or distended. No masses palpated. Extremities show no  edema. neuro grossly intact  ECG Sinus bradycardia with occasional PVC and PAC. Normal axis.No ST changes.

## 2015-09-23 ENCOUNTER — Ambulatory Visit (INDEPENDENT_AMBULATORY_CARE_PROVIDER_SITE_OTHER): Payer: Medicare Other | Admitting: Cardiology

## 2015-09-23 ENCOUNTER — Encounter: Payer: Self-pay | Admitting: Cardiology

## 2015-09-23 VITALS — BP 106/70 | HR 57 | Ht 70.0 in | Wt 177.0 lb

## 2015-09-23 DIAGNOSIS — I2583 Coronary atherosclerosis due to lipid rich plaque: Secondary | ICD-10-CM

## 2015-09-23 DIAGNOSIS — I2510 Atherosclerotic heart disease of native coronary artery without angina pectoris: Secondary | ICD-10-CM

## 2015-09-23 DIAGNOSIS — E785 Hyperlipidemia, unspecified: Secondary | ICD-10-CM | POA: Diagnosis not present

## 2015-09-23 DIAGNOSIS — I350 Nonrheumatic aortic (valve) stenosis: Secondary | ICD-10-CM

## 2015-09-23 DIAGNOSIS — I10 Essential (primary) hypertension: Secondary | ICD-10-CM | POA: Diagnosis not present

## 2015-09-23 NOTE — Patient Instructions (Signed)
Your physician wants you to follow-up in: ONE YEAR WITH DR CRENSHAW You will receive a reminder letter in the mail two months in advance. If you don't receive a letter, please call our office to schedule the follow-up appointment.   If you need a refill on your cardiac medications before your next appointment, please call your pharmacy.  

## 2015-09-23 NOTE — Assessment & Plan Note (Addendum)
Continue statin. Lipids and liver monitored by primary care. 

## 2015-09-23 NOTE — Assessment & Plan Note (Signed)
Plan repeat CTA thoracic aortaFebruary 2018.

## 2015-09-23 NOTE — Assessment & Plan Note (Signed)
Most recent echocardiogram showed sclerotic aortic valve. Previously mild. Plan follow-up echoes in the future.

## 2015-09-23 NOTE — Assessment & Plan Note (Signed)
Continue aspirin and statin. 

## 2015-09-23 NOTE — Assessment & Plan Note (Signed)
Blood pressure controlled. Continue present medications. 

## 2016-08-31 ENCOUNTER — Encounter: Payer: Self-pay | Admitting: *Deleted

## 2016-08-31 ENCOUNTER — Encounter: Payer: Self-pay | Admitting: Cardiology

## 2016-08-31 NOTE — Telephone Encounter (Signed)
This encounter was created in error - please disregard.

## 2016-08-31 NOTE — Telephone Encounter (Signed)
New Message       Returning [employer] please call on cell phone

## 2016-08-31 NOTE — Telephone Encounter (Signed)
Patient has a follow up appointment 09-16-16, he is also due for a CTA to follow up on his thoracic aneurysm the same day. Left message for pt to call to discuss. He will need a bmp prior to CTA.

## 2016-09-04 NOTE — Progress Notes (Signed)
HPI: FU aortic stenosis. 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 Note Calcium score significantly elevated. Holter monitor in May of 2011 showed sinus rhythm with PACs and PVCs. Nuclear study in February of 2014 showed an ejection fraction of 71% and normal perfusion. Last echocardiogram February 2016 showed normal LV function, grade 1 diastolic dysfunction, sclerotic aortic valve with mild aortic insufficiency. Follow-up CTA February 2017 showed aneurysmal dilatation of the ascending aorta at 4.3 cm. Since I last saw him, the patient denies any dyspnea on exertion, orthopnea, PND, pedal edema, palpitations, syncope or chest pain.   Current Outpatient Prescriptions  Medication Sig Dispense Refill  . acetaminophen (TYLENOL) 650 MG CR tablet Take 650 mg by mouth every 8 (eight) hours as needed.    Marland Kitchen amLODipine-valsartan (EXFORGE) 10-320 MG per tablet Take 1 tablet by mouth daily.    Marland Kitchen aspirin 81 MG tablet Take 81 mg by mouth daily.     Marland Kitchen atorvastatin (LIPITOR) 80 MG tablet Take 1 tablet (80 mg total) by mouth daily. 90 tablet 3  . celecoxib (CELEBREX) 200 MG capsule Take 200 mg by mouth daily as needed.    . Coenzyme Q10 (CO Q 10 PO) Take 1 tablet by mouth daily.    . finasteride (PROSCAR) 5 MG tablet Take 5 mg by mouth daily.    Marland Kitchen omeprazole (PRILOSEC) 20 MG capsule Take 20 mg by mouth daily.    Marland Kitchen zolpidem (AMBIEN) 10 MG tablet Take 10 mg by mouth at bedtime as needed.     No current facility-administered medications for this visit.      Past Medical History:  Diagnosis Date  . Aortic stenosis   . Arthritis   . CAD (coronary artery disease)   . Dyspepsia   . GERD (gastroesophageal reflux disease)   . Hyperlipidemia   . Hypertension   . Malignant neoplasm of prostate [facility])     Past Surgical History:  Procedure Laterality Date  . appendectomy    . PARATHYROIDECTOMY    . PROSTATE SURGERY    . TONSILLECTOMY    . TRIGGER FINGER RELEASE       Social History   Social History  . Marital status: Married    Spouse name: N/A  . Number of children: 2  . Years of education: N/A   Occupational History  .      Retired   Social History Main Topics  . Smoking status: Never Smoker  . Smokeless tobacco: Never Used  . Alcohol use Yes     Comment: Rarely  . Drug use: Unknown  . Sexual activity: Not on file   Other Topics Concern  . Not on file   Social History Narrative  . No narrative on file    Family History  Problem Relation Age of Onset  . Coronary artery disease Father     age 79    ROS: no fevers or chills, productive cough, hemoptysis, dysphasia, odynophagia, melena, hematochezia, dysuria, hematuria, rash, seizure activity, orthopnea, PND, pedal edema, claudication. Remaining systems are negative.  Physical Exam: Well-developed well-nourished in no acute distress.  Skin is warm and dry.  HEENT is normal.  Neck is supple.  Chest is clear to auscultation with normal expansion.  Cardiovascular exam is regular rate and rhythm. 2/6 systolic murmur left sternal border. S2 is not diminished. Abdominal exam nontender or distended. No masses palpated. Extremities show no edema. neuro grossly intact  ECG-Sinus bradycardia at a  rate of 53. No ST changes.  A/P  1 Thoracic aortic aneurysm-plan follow-up chest CTA.  2 aortic stenosis-most recent echocardiogram showed sclerosis without significant stenosis. Will repeat echo in 1 year.  3 coronary artery disease-continue aspirin and statin.  4 hypertension-blood pressure controlled. Continue present medications. Check K and renal function.  5 Hyperlipidemia-check lipids and liver.  Kirk Ruths, MD

## 2016-09-16 ENCOUNTER — Encounter: Payer: Self-pay | Admitting: Cardiology

## 2016-09-16 ENCOUNTER — Ambulatory Visit (INDEPENDENT_AMBULATORY_CARE_PROVIDER_SITE_OTHER): Payer: Medicare Other | Admitting: Cardiology

## 2016-09-16 VITALS — BP 122/80 | HR 53 | Ht 70.0 in | Wt 176.0 lb

## 2016-09-16 DIAGNOSIS — I2510 Atherosclerotic heart disease of native coronary artery without angina pectoris: Secondary | ICD-10-CM | POA: Diagnosis not present

## 2016-09-16 DIAGNOSIS — I712 Thoracic aortic aneurysm, without rupture, unspecified: Secondary | ICD-10-CM

## 2016-09-16 LAB — BASIC METABOLIC PANEL
BUN: 20 mg/dL (ref 7–25)
CALCIUM: 9.9 mg/dL (ref 8.6–10.3)
CO2: 26 mmol/L (ref 20–31)
CREATININE: 1.43 mg/dL — AB (ref 0.70–1.18)
Chloride: 106 mmol/L (ref 98–110)
Glucose, Bld: 97 mg/dL (ref 65–99)
Potassium: 5.3 mmol/L (ref 3.5–5.3)
Sodium: 140 mmol/L (ref 135–146)

## 2016-09-16 LAB — HEPATIC FUNCTION PANEL
ALBUMIN: 4.4 g/dL (ref 3.6–5.1)
ALK PHOS: 87 U/L (ref 40–115)
ALT: 15 U/L (ref 9–46)
AST: 20 U/L (ref 10–35)
BILIRUBIN INDIRECT: 0.6 mg/dL (ref 0.2–1.2)
Bilirubin, Direct: 0.1 mg/dL (ref <=0.2)
TOTAL PROTEIN: 7.3 g/dL (ref 6.1–8.1)
Total Bilirubin: 0.7 mg/dL (ref 0.2–1.2)

## 2016-09-16 LAB — LIPID PANEL
CHOLESTEROL: 173 mg/dL (ref <200)
HDL: 91 mg/dL (ref >40)
LDL CALC: 66 mg/dL (ref <100)
TRIGLYCERIDES: 79 mg/dL (ref <150)
Total CHOL/HDL Ratio: 1.9 Ratio (ref <5.0)
VLDL: 16 mg/dL (ref <30)

## 2016-09-16 NOTE — Patient Instructions (Signed)
Medication Instructions:   NO CHANGE  Labwork:  Your physician recommends that you return for lab work WHEN FASTING  Testing/Procedures:  Non-Cardiac CT Angiography (CTA), is a special type of CT scan that uses a computer to produce multi-dimensional views of major blood vessels throughout the body. In CT angiography, a contrast material is injected through an IV to help visualize the blood vessels CTA OF THE CHEST W/WO TO FOLLOW UP THORACIC ANEURYSM  Follow-Up:  Your physician wants you to follow-up in: [place] will receive a reminder letter in the mail two months in advance. If you don't receive a letter, please call our office to schedule the follow-up appointment.   If you need a refill on your cardiac medications before your next appointment, please call your pharmacy.

## 2016-09-24 ENCOUNTER — Ambulatory Visit (INDEPENDENT_AMBULATORY_CARE_PROVIDER_SITE_OTHER)
Admission: RE | Admit: 2016-09-24 | Discharge: 2016-09-24 | Disposition: A | Discharge status: Home / Self Care | Payer: Medicare Other | Source: Ambulatory Visit | Attending: Cardiology | Admitting: Cardiology

## 2016-09-24 DIAGNOSIS — I712 Thoracic aortic aneurysm, without rupture, unspecified: Secondary | ICD-10-CM

## 2016-09-24 MED ORDER — IOPAMIDOL (ISOVUE-370) INJECTION 76%
100.0000 mL | Freq: Once | INTRAVENOUS | Status: AC | PRN
  Administered 2016-09-24: 100 mL via INTRAVENOUS

## 2017-05-27 ENCOUNTER — Telehealth: Payer: Self-pay | Admitting: *Deleted

## 2017-05-27 NOTE — Telephone Encounter (Signed)
Spoke to patient . Patient states he needs a letter for the [place].  THE [employer] wants to decrease disability from 100% to 0. Patient states he needs a letter stating certain diagnosis that dr Stanford Breed is treating him over the years.patient states he will bring the papers next week  ( papers will have information on how to word the letter ) RN States that will be helpful. ANY QUESTION OFFICE WILL CALL PATIENT.

## 2017-06-22 ENCOUNTER — Encounter: Payer: Self-pay | Admitting: *Deleted

## 2017-07-15 ENCOUNTER — Telehealth: Payer: Self-pay | Admitting: Cardiology

## 2017-07-15 DIAGNOSIS — I712 Thoracic aortic aneurysm, without rupture, unspecified: Secondary | ICD-10-CM

## 2017-07-15 NOTE — Telephone Encounter (Signed)
Left msg to call.

## 2017-07-15 NOTE — Telephone Encounter (Signed)
New message    Patient calling to request order for CT angio, chest/ aorta. Please advise.

## 2017-07-19 NOTE — Telephone Encounter (Signed)
Spoke with pt, he would like to ge the CTA scheduled prior to his follow up appointment in march. CTA scheduled for Friday 09-24-17 @ 9:30 am. Lab orders mailed to the pt.

## 2017-09-15 LAB — BASIC METABOLIC PANEL
BUN / CREAT RATIO: 20 (ref 10–24)
BUN: 24 mg/dL (ref 8–27)
CHLORIDE: 102 mmol/L (ref 96–106)
CO2: 24 mmol/L (ref 20–29)
Calcium: 9.9 mg/dL (ref 8.6–10.2)
Creatinine, Ser: 1.21 mg/dL (ref 0.76–1.27)
GFR calc Af Amer: 66 mL/min/{1.73_m2} (ref >59)
GFR calc non Af Amer: 57 mL/min/{1.73_m2} — ABNORMAL LOW (ref >59)
GLUCOSE: 96 mg/dL (ref 65–99)
POTASSIUM: 4.8 mmol/L (ref 3.5–5.2)
SODIUM: 140 mmol/L (ref 134–144)

## 2017-09-23 NOTE — Progress Notes (Signed)
HPI: FU aortic stenosis. 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 Note Calcium score significantly elevated. Holter monitor in May of 2011 showed sinus rhythm with PACs and PVCs. Nuclear study in February of 2014 showed an ejection fraction of 71% and normal perfusion. Last echocardiogram February 2016 showed normal LV function, grade 1 diastolic dysfunction, sclerotic aortic valve with mild aortic insufficiency. Follow-up CTA 3/19 showed aneurysmal dilatation of the ascending aorta at 4.3 x 4.2 cm.  There is note of a mass in the right lobe of the thyroid.  Further evaluation recommended.  Since I last saw him, the patient has dyspnea with more extreme activities but not with routine activities. It is relieved with rest. It is not associated with chest pain. There is no orthopnea, PND or pedal edema. There is no syncope or palpitations. There is no exertional chest pain.   Current Outpatient Medications  Medication Sig Dispense Refill  . acetaminophen (TYLENOL) 650 MG CR tablet Take 650 mg by mouth every 8 (eight) hours as needed.    Marland Kitchen amLODipine-valsartan (EXFORGE) 10-320 MG per tablet Take 1 tablet by mouth daily.    Marland Kitchen aspirin 81 MG tablet Take 81 mg by mouth daily.     Marland Kitchen atorvastatin (LIPITOR) 80 MG tablet Take 1 tablet (80 mg total) by mouth daily. 90 tablet 3  . celecoxib (CELEBREX) 200 MG capsule Take 200 mg by mouth daily as needed.    . Coenzyme Q10 (CO Q 10 PO) Take 1 tablet by mouth daily.    . finasteride (PROSCAR) 5 MG tablet Take 5 mg by mouth daily.    Marland Kitchen omeprazole (PRILOSEC) 20 MG capsule Take 20 mg by mouth daily.    Marland Kitchen zolpidem (AMBIEN) 10 MG tablet Take 10 mg by mouth at bedtime as needed.     No current facility-administered medications for this visit.      Past Medical History:  Diagnosis Date  . Aortic stenosis   . Arthritis   . CAD (coronary artery disease)   . Dyspepsia   . GERD (gastroesophageal reflux disease)   .  Hyperlipidemia   . Hypertension   . Malignant neoplasm of prostate [facility])     Past Surgical History:  Procedure Laterality Date  . appendectomy    . PARATHYROIDECTOMY    . PROSTATE SURGERY    . TONSILLECTOMY    . TRIGGER FINGER RELEASE      Social History   Socioeconomic History  . Marital status: Married    Spouse name: Not on file  . Number of children: 2  . Years of education: Not on file  . Highest education level: Not on file  Social Needs  . Financial resource strain: Not on file  . Food insecurity - worry: Not on file  . Food insecurity - inability: Not on file  . Transportation needs - medical: Not on file  . Transportation needs - non-medical: Not on file  Occupational History    Comment: Retired  Tobacco Use  . Smoking status: Never Smoker  . Smokeless tobacco: Never Used  Substance and Sexual Activity  . Alcohol use: Yes    Comment: Rarely  . Drug use: Not on file  . Sexual activity: Not on file  Other Topics Concern  . Not on file  Social History Narrative  . Not on file    Family History  Problem Relation Age of Onset  . Coronary artery disease Father  age 34    ROS: no fevers or chills, productive cough, hemoptysis, dysphasia, odynophagia, melena, hematochezia, dysuria, hematuria, rash, seizure activity, orthopnea, PND, pedal edema, claudication. Remaining systems are negative.  Physical Exam: Well-developed well-nourished in no acute distress.  Skin is warm and dry.  HEENT is normal.  Neck is supple.  Chest is clear to auscultation with normal expansion.  Cardiovascular exam is regular rate and rhythm. 2/6 systolic murmur Abdominal exam nontender or distended. No masses palpated. Extremities show no edema. neuro grossly intact  ECG-sinus rhythm with occasional PACs.  Low voltage.  No ST changes.  Personally reviewed  A/P  1 thoracic aortic aneurysm-plan follow-up chest CTA March 2020.  2 aortic stenosis-we will plan follow-up  echocardiogram.  3 coronary artery disease-continue aspirin and statin.  4 hypertension-blood pressure is controlled.  Continue present medications.  5 hyperlipidemia-continue statin.  Check lipids and liver.  6 thyroid nodule-patient has already arranged a thyroid ultrasound and will follow up with his primary care.  Kirk Ruths, MD

## 2017-09-24 ENCOUNTER — Ambulatory Visit (INDEPENDENT_AMBULATORY_CARE_PROVIDER_SITE_OTHER)
Admission: RE | Admit: 2017-09-24 | Discharge: 2017-09-24 | Disposition: A | Discharge status: Home / Self Care | Payer: Medicare Other | Source: Ambulatory Visit | Attending: Cardiology | Admitting: Cardiology

## 2017-09-24 DIAGNOSIS — I712 Thoracic aortic aneurysm, without rupture, unspecified: Secondary | ICD-10-CM

## 2017-09-24 MED ORDER — IOPAMIDOL (ISOVUE-370) INJECTION 76%
100.0000 mL | Freq: Once | INTRAVENOUS | Status: AC | PRN
  Administered 2017-09-24: 100 mL via INTRAVENOUS

## 2017-09-27 ENCOUNTER — Telehealth: Payer: Self-pay | Admitting: *Deleted

## 2017-09-27 DIAGNOSIS — E041 Nontoxic single thyroid nodule: Secondary | ICD-10-CM

## 2017-09-27 NOTE — Telephone Encounter (Addendum)
-----   Message from Lelon Perla, MD sent at 09/24/2017  9:57 AM EST ----- Please arrange dedicated thyroid ultrasound Repeat CTA 1 year Kirk Ruths   Left message for pt to call, order placed

## 2017-09-27 NOTE — Telephone Encounter (Signed)
Spoke with pt, he has reviewed the results through my chart and has already arranged follow up ultrasound through his medical doctor.

## 2017-09-30 ENCOUNTER — Encounter: Payer: Self-pay | Admitting: Cardiology

## 2017-09-30 ENCOUNTER — Ambulatory Visit (INDEPENDENT_AMBULATORY_CARE_PROVIDER_SITE_OTHER): Payer: Medicare Other | Admitting: Cardiology

## 2017-09-30 VITALS — BP 122/60 | HR 59 | Ht 70.0 in | Wt 170.8 lb

## 2017-09-30 DIAGNOSIS — E7800 Pure hypercholesterolemia, unspecified: Secondary | ICD-10-CM

## 2017-09-30 DIAGNOSIS — I10 Essential (primary) hypertension: Secondary | ICD-10-CM

## 2017-09-30 DIAGNOSIS — I350 Nonrheumatic aortic (valve) stenosis: Secondary | ICD-10-CM

## 2017-09-30 NOTE — Patient Instructions (Signed)
Medication Instructions:   NO CHANGE  Labwork:  Your physician recommends that you HAVE LAB WORK TODAY  Testing/Procedures:  Your physician has requested that you have an echocardiogram. Echocardiography is a painless test that uses sound waves to create images of your heart. It provides your doctor with information about the size and shape of your heart and how well your heart's chambers and valves are working. This procedure takes approximately one hour. There are no restrictions for this procedure.    Follow-Up:  Your physician wants you to follow-up in: [place] will receive a reminder letter in the mail two months in advance. If you don't receive a letter, please call our office to schedule the follow-up appointment.   If you need a refill on your cardiac medications before your next appointment, please call your pharmacy.

## 2017-10-01 LAB — HEPATIC FUNCTION PANEL
ALT: 14 IU/L (ref 0–44)
AST: 18 IU/L (ref 0–40)
Albumin: 4.4 g/dL (ref 3.5–4.8)
Alkaline Phosphatase: 95 IU/L (ref 39–117)
Bilirubin Total: 0.5 mg/dL (ref 0.0–1.2)
Bilirubin, Direct: 0.16 mg/dL (ref 0.00–0.40)
Total Protein: 7.1 g/dL (ref 6.0–8.5)

## 2017-10-01 LAB — LIPID PANEL
CHOLESTEROL TOTAL: 161 mg/dL (ref 100–199)
Chol/HDL Ratio: 2.1 ratio (ref 0.0–5.0)
HDL: 75 mg/dL (ref >39)
LDL CALC: 65 mg/dL (ref 0–99)
TRIGLYCERIDES: 105 mg/dL (ref 0–149)
VLDL CHOLESTEROL CAL: 21 mg/dL (ref 5–40)

## 2017-10-11 ENCOUNTER — Other Ambulatory Visit: Payer: Self-pay

## 2017-10-11 ENCOUNTER — Ambulatory Visit (HOSPITAL_COMMUNITY): Payer: Medicare Other | Attending: Cardiovascular Disease

## 2017-10-11 DIAGNOSIS — I119 Hypertensive heart disease without heart failure: Secondary | ICD-10-CM | POA: Diagnosis not present

## 2017-10-11 DIAGNOSIS — I350 Nonrheumatic aortic (valve) stenosis: Secondary | ICD-10-CM | POA: Insufficient documentation

## 2017-10-11 DIAGNOSIS — E785 Hyperlipidemia, unspecified: Secondary | ICD-10-CM | POA: Diagnosis not present

## 2017-10-11 DIAGNOSIS — I351 Nonrheumatic aortic (valve) insufficiency: Secondary | ICD-10-CM | POA: Insufficient documentation

## 2017-10-11 DIAGNOSIS — I2510 Atherosclerotic heart disease of native coronary artery without angina pectoris: Secondary | ICD-10-CM | POA: Diagnosis not present

## 2018-09-27 NOTE — Progress Notes (Signed)
HPI: FU aortic stenosis. 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 Note Calcium score significantly elevated. Holter monitor in May of 2011 showed sinus rhythm with PACs and PVCs. Nuclear study in February of 2014 showed an ejection fraction of 71% and normal perfusion. Follow-up CTA 3/19 showed aneurysmal dilatation of the ascending aorta at 4.3 x 4.2 cm.  There is note of a mass in the right lobe of the thyroid. Last echocardiogram March 2019 showed normal LV function, mild left ventricular hypertrophy, moderate diastolic dysfunction, mild aortic stenosis with mean gradient 12 mmHg,  mild aortic insufficiency and mild left atrial enlargement.  Since I last saw him,he has some dyspnea on exertion but no orthopnea, PND, pedal edema or syncope.  Occasional chest discomfort as he initiates walks but improves as he continues to walk.  This has been present for years and is unchanged.  Current Outpatient Medications  Medication Sig Dispense Refill  . acetaminophen (TYLENOL) 650 MG CR tablet Take 650 mg by mouth every 8 (eight) hours as needed.    Marland Kitchen amLODipine-valsartan (EXFORGE) 10-320 MG per tablet Take 1 tablet by mouth daily.    Marland Kitchen aspirin 81 MG tablet Take 81 mg by mouth daily.     Marland Kitchen atorvastatin (LIPITOR) 80 MG tablet Take 1 tablet (80 mg total) by mouth daily. 90 tablet 3  . celecoxib (CELEBREX) 200 MG capsule Take 200 mg by mouth daily as needed.    . Coenzyme Q10 (CO Q 10 PO) Take 1 tablet by mouth daily.    . finasteride (PROSCAR) 5 MG tablet Take 5 mg by mouth daily.    Marland Kitchen omeprazole (PRILOSEC) 20 MG capsule Take 20 mg by mouth daily.    Marland Kitchen zolpidem (AMBIEN) 10 MG tablet Take 10 mg by mouth at bedtime as needed.     No current facility-administered medications for this visit.      Past Medical History:  Diagnosis Date  . Aortic stenosis   . Arthritis   . CAD (coronary artery disease)   . Dyspepsia   . GERD (gastroesophageal reflux disease)     . Hyperlipidemia   . Hypertension   . Malignant neoplasm of prostate [facility])     Past Surgical History:  Procedure Laterality Date  . appendectomy    . PARATHYROIDECTOMY    . PROSTATE SURGERY    . TONSILLECTOMY    . TRIGGER FINGER RELEASE      Social History   Socioeconomic History  . Marital status: Married    Spouse name: Not on file  . Number of children: 2  . Years of education: Not on file  . Highest education level: Not on file  Occupational History    Comment: Retired  Scientific laboratory technician  . Financial resource strain: Not on file  . Food insecurity:    Worry: Not on file    Inability: Not on file  . Transportation needs:    Medical: Not on file    Non-medical: Not on file  Tobacco Use  . Smoking status: Never Smoker  . Smokeless tobacco: Never Used  Substance and Sexual Activity  . Alcohol use: Yes    Comment: Rarely  . Drug use: Not on file  . Sexual activity: Not on file  Lifestyle  . Physical activity:    Days per week: Not on file    Minutes per session: Not on file  . Stress: Not on file  Relationships  .  Social connections:    Talks on phone: Not on file    Gets together: Not on file    Attends religious service: Not on file    Active member of club or organization: Not on file    Attends meetings of clubs or organizations: Not on file    Relationship status: Not on file  . Intimate partner violence:    Fear of current or ex partner: Not on file    Emotionally abused: Not on file    Physically abused: Not on file    Forced sexual activity: Not on file  Other Topics Concern  . Not on file  Social History Narrative  . Not on file    Family History  Problem Relation Age of Onset  . Coronary artery disease Father        age 56    ROS: no fevers or chills, productive cough, hemoptysis, dysphasia, odynophagia, melena, hematochezia, dysuria, hematuria, rash, seizure activity, orthopnea, PND, pedal edema, claudication. Remaining systems are  negative.  Physical Exam: Well-developed well-nourished in no acute distress.  Skin is warm and dry.  HEENT is normal.  Neck is supple.  Chest is clear to auscultation with normal expansion.  Cardiovascular exam is regular rate and rhythm. 2/6 systolic murmur; S2 not diminished Abdominal exam nontender or distended. No masses palpated. Extremities show no edema. neuro grossly intact  ECG-sinus rhythm with occasional PAC and PVC.  Personally reviewed  A/P  1 thoracic aortic aneurysm-plan follow-up chest CTA to reassess.  2 aortic stenosis-we will repeat echocardiogram.  3 coronary artery disease-no chest pain.  Continue medical therapy with aspirin and statin.  4 hypertension-patient's blood pressure is controlled.  Continue present medications and follow. Check Bmet.  5 hyperlipidemia-continue statin.  Check lipids and liver.  Kirk Ruths, MD

## 2018-10-03 ENCOUNTER — Encounter: Payer: Self-pay | Admitting: Cardiology

## 2018-10-03 ENCOUNTER — Ambulatory Visit (INDEPENDENT_AMBULATORY_CARE_PROVIDER_SITE_OTHER): Payer: Medicare Other | Admitting: Cardiology

## 2018-10-03 VITALS — BP 128/70 | HR 74 | Ht 70.0 in | Wt 172.0 lb

## 2018-10-03 DIAGNOSIS — I10 Essential (primary) hypertension: Secondary | ICD-10-CM | POA: Diagnosis not present

## 2018-10-03 DIAGNOSIS — E7800 Pure hypercholesterolemia, unspecified: Secondary | ICD-10-CM | POA: Diagnosis not present

## 2018-10-03 DIAGNOSIS — I712 Thoracic aortic aneurysm, without rupture, unspecified: Secondary | ICD-10-CM

## 2018-10-03 DIAGNOSIS — I350 Nonrheumatic aortic (valve) stenosis: Secondary | ICD-10-CM

## 2018-10-03 LAB — COMPREHENSIVE METABOLIC PANEL
ALBUMIN: 4.5 g/dL (ref 3.7–4.7)
ALK PHOS: 93 IU/L (ref 39–117)
ALT: 15 IU/L (ref 0–44)
AST: 25 IU/L (ref 0–40)
Albumin/Globulin Ratio: 1.9 (ref 1.2–2.2)
BUN/Creatinine Ratio: 24 (ref 10–24)
BUN: 33 mg/dL — ABNORMAL HIGH (ref 8–27)
Bilirubin Total: 0.6 mg/dL (ref 0.0–1.2)
CALCIUM: 9.9 mg/dL (ref 8.6–10.2)
CO2: 20 mmol/L (ref 20–29)
CREATININE: 1.35 mg/dL — AB (ref 0.76–1.27)
Chloride: 101 mmol/L (ref 96–106)
GFR calc Af Amer: 57 mL/min/{1.73_m2} — ABNORMAL LOW (ref >59)
GFR, EST NON AFRICAN AMERICAN: 50 mL/min/{1.73_m2} — AB (ref >59)
Globulin, Total: 2.4 g/dL (ref 1.5–4.5)
Glucose: 97 mg/dL (ref 65–99)
Potassium: 4.8 mmol/L (ref 3.5–5.2)
Sodium: 137 mmol/L (ref 134–144)
Total Protein: 6.9 g/dL (ref 6.0–8.5)

## 2018-10-03 LAB — LIPID PANEL
Chol/HDL Ratio: 2 ratio (ref 0.0–5.0)
Cholesterol, Total: 164 mg/dL (ref 100–199)
HDL: 82 mg/dL (ref >39)
LDL Calculated: 64 mg/dL (ref 0–99)
Triglycerides: 89 mg/dL (ref 0–149)
VLDL CHOLESTEROL CAL: 18 mg/dL (ref 5–40)

## 2018-10-03 NOTE — Patient Instructions (Signed)
Medication Instructions:  NO CHANGE If you need a refill on your cardiac medications before your next appointment, please call your pharmacy.   Lab work: Your physician recommends that you HAVE LAB WORK TODAY If you have labs (blood work) drawn today and your tests are completely normal, you will receive your results only by: Marland Kitchen MyChart Message (if you have MyChart) OR . A paper copy in the mail If you have any lab test that is abnormal or we need to change your treatment, we will call you to review the results.  Testing/Procedures: Your physician has requested that you have an echocardiogram. Echocardiography is a painless test that uses sound waves to create images of your heart. It provides your doctor with information about the size and shape of your heart and how well your heart's chambers and valves are working. This procedure takes approximately one hour. There are no restrictions for this procedure.  [place] W/WO TO FOLLOW UP ON THORACIC [place]  Follow-Up: At [facility], you and your health needs are our priority.  As part of our continuing mission to provide you with exceptional heart care, we have created designated Provider Care Teams.  These Care Teams include your primary Cardiologist (physician) and Advanced Practice Providers (APPs -  Physician Assistants and Nurse Practitioners) who all work together to provide you with the care you need, when you need it. You will need a follow up appointment in 12 months.  Please call our office 2 months in advance to schedule this appointment.  You may see Kirk Ruths MD or one of the following Advanced Practice Providers on your designated Care Team:   Kerin Ransom, PA-C Roby [place]fts, [place] . Sande Rives, PA-C

## 2018-10-07 NOTE — Addendum Note (Signed)
Addended by: Venetia Maxon on: 10/07/2018 03:16 PM   Modules accepted: Orders

## 2018-10-21 ENCOUNTER — Telehealth: Payer: Self-pay | Admitting: *Deleted

## 2018-10-21 NOTE — Telephone Encounter (Signed)
Called patient left message to call back to CT about upcoming appointment. Please call back [phone].

## 2018-10-31 ENCOUNTER — Ambulatory Visit (HOSPITAL_COMMUNITY): Payer: Medicare Other

## 2018-10-31 ENCOUNTER — Inpatient Hospital Stay: Admission: RE | Admit: 2018-10-31 | Payer: Medicare Other | Source: Ambulatory Visit

## 2018-11-17 ENCOUNTER — Telehealth: Payer: Self-pay | Admitting: Cardiology

## 2018-11-17 NOTE — Telephone Encounter (Signed)
Ok to have studies in [place]

## 2018-11-17 NOTE — Telephone Encounter (Signed)
Left message for patient of dr crenshaw's recommendations. 

## 2018-11-17 NOTE — Telephone Encounter (Signed)
PT called this morning. He is moving to [place] 05/29. He had some testing initially scheduled, but was postponed due to Covid. He wanted to let us know that once he gets settled in [place], his other Cardiologist will be asking for records to do the testing.  If Dr. Stanford Breed wants to do the testing before the pt moves, call him so he can set everything up.

## 2019-02-02 ENCOUNTER — Telehealth: Payer: Self-pay | Admitting: *Deleted

## 2019-02-02 NOTE — Telephone Encounter (Signed)
Left message for patient to call regarding chest CTA that was cancelled 10/31/18.  WIll also send My Chart Message.

## 2019-02-08 NOTE — Telephone Encounter (Signed)
Left message for patient to call to reschedule CTA Chest Aorta that was cancelled in March due to pandemic

## 2019-06-15 IMAGING — CT CT ANGIO CHEST
2 of 7 series · 17 of 46 positions shown · IV contrast (iopamidol)
Comparison: Chest CT September 24, 2016

CLINICAL DATA: Thoracic aortic prominence

EXAM:
CT ANGIOGRAPHY CHEST WITH CONTRAST
TECHNIQUE: Multidetector CT imaging of the chest was performed using the
standard protocol during bolus administration of intravenous
contrast. Multiplanar CT image reconstructions and MIPs were
obtained to evaluate the vascular anatomy.
CONTRAST:  100mL [id number] IOPAMIDOL ([id number]) INJECTION 76%

[Series 4: aorta 3.0 i31f 2 · axial · 0.75mm/px · z∈[-368,-50]mm · 14 of 116 slices shown]
[im 5/116  lung]
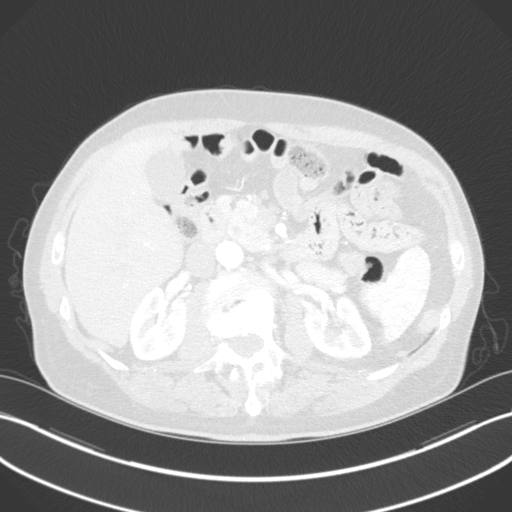
[im 14/116  soft-tissue]
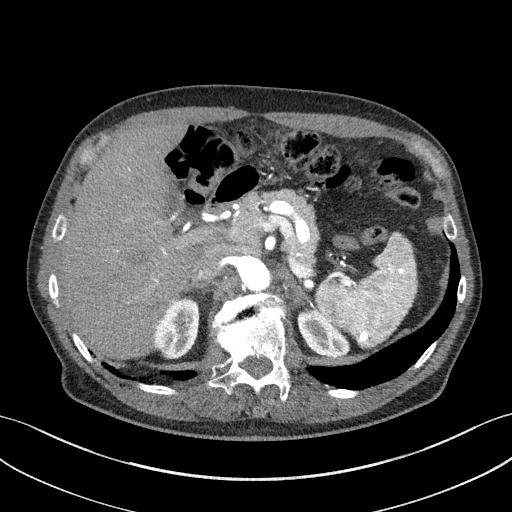
[im 23/116  lung]
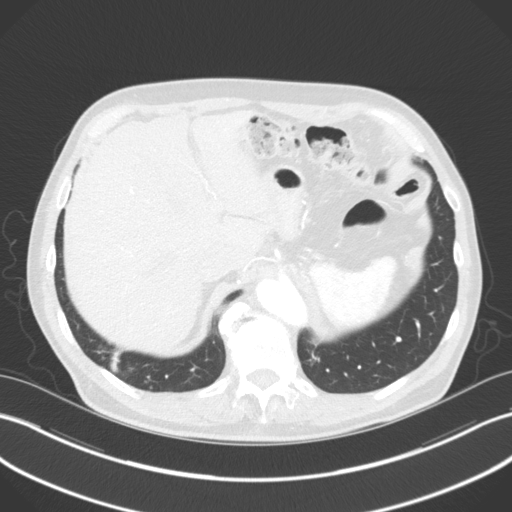
[im 31/116  soft-tissue]
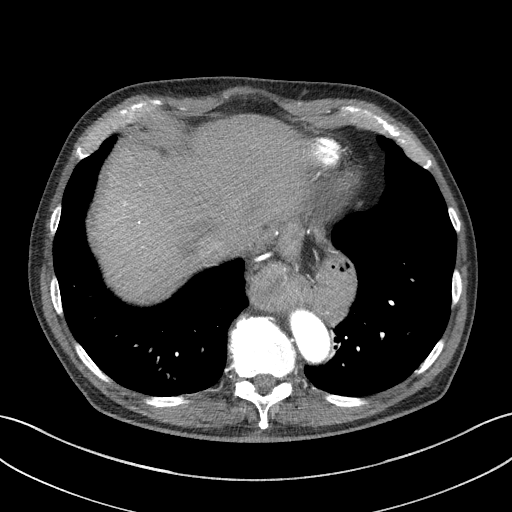
[im 40/116  lung]
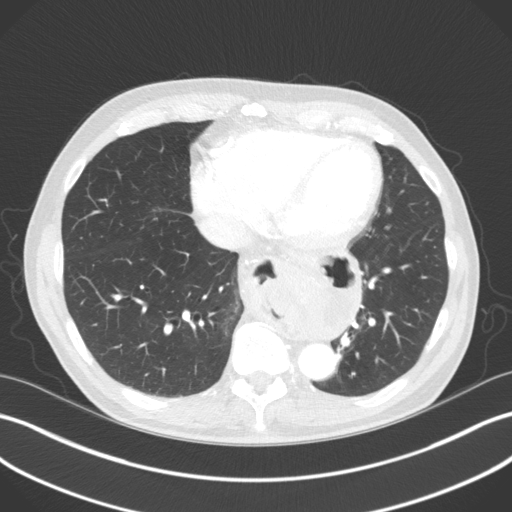
[im 45/116  soft-tissue]
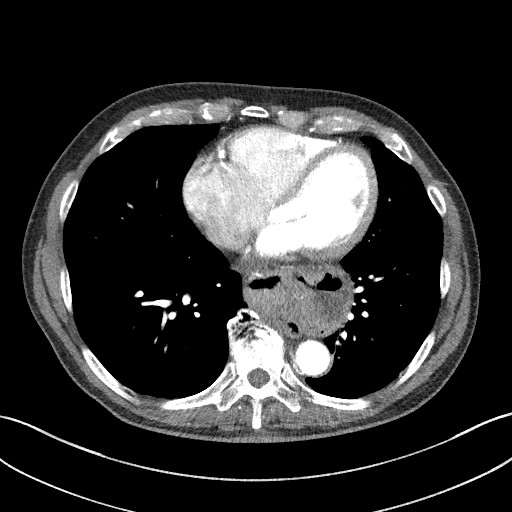
[im 54/116  lung]
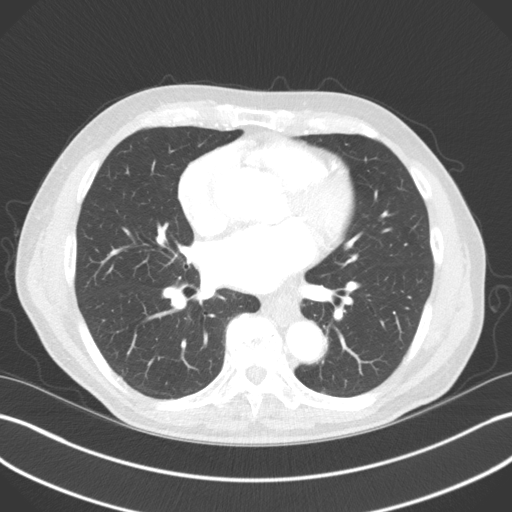
[im 62/116  soft-tissue]
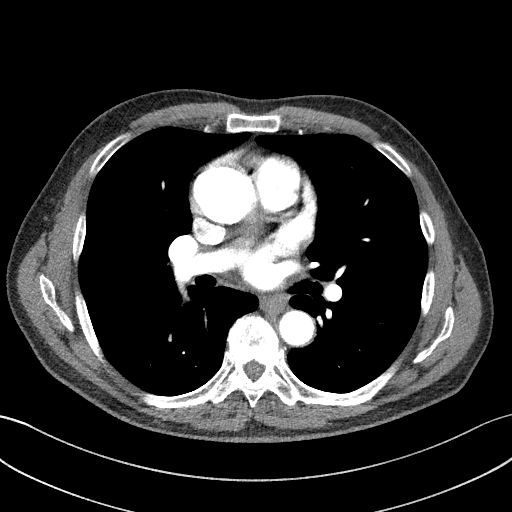
[im 71/116  lung]
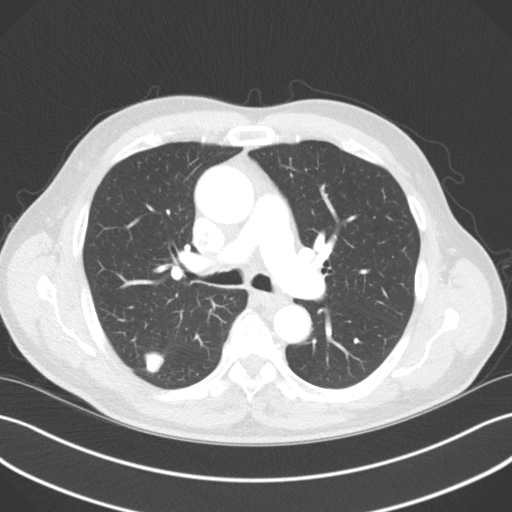
[im 76/116  soft-tissue]
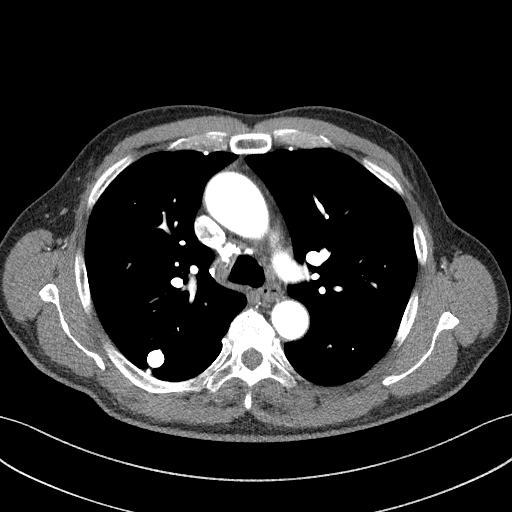
[im 85/116  lung]
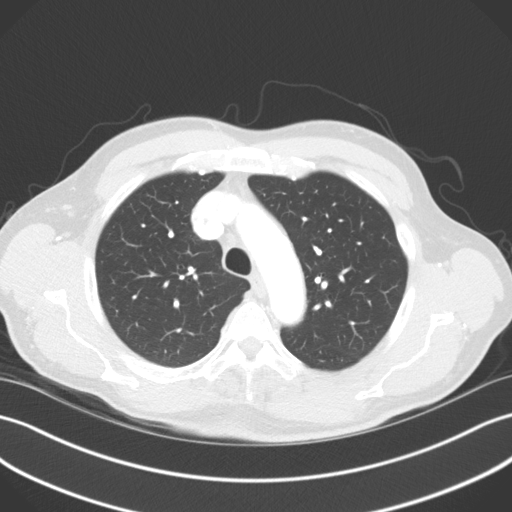
[im 93/116  soft-tissue]
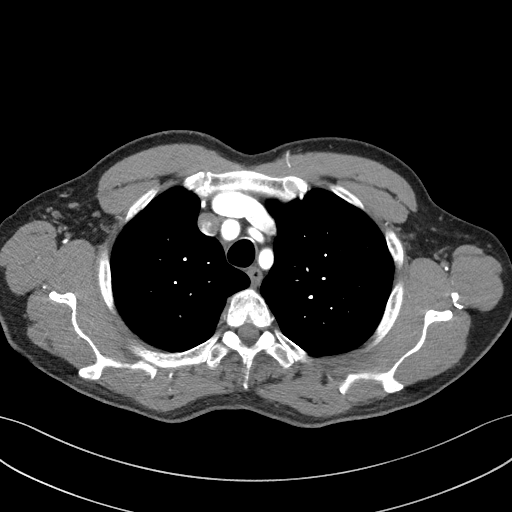
[im 102/116  lung]
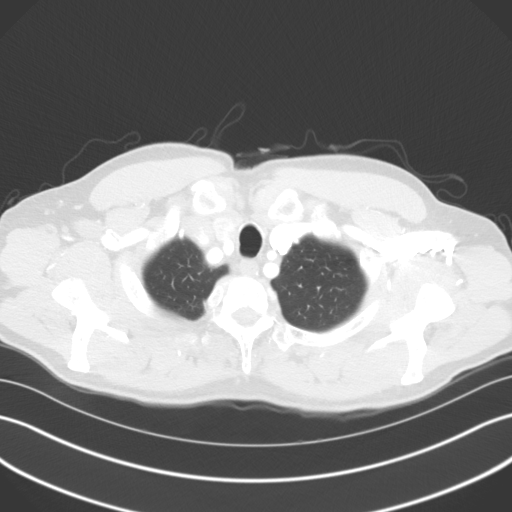
[im 111/116  soft-tissue]
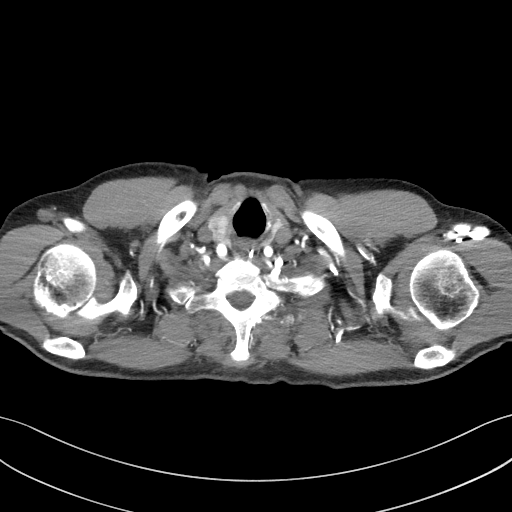

[Series 7: coronals · coronal · 0.70mm/px · 3 of 127 slices shown]
[im 32/127  soft-tissue]
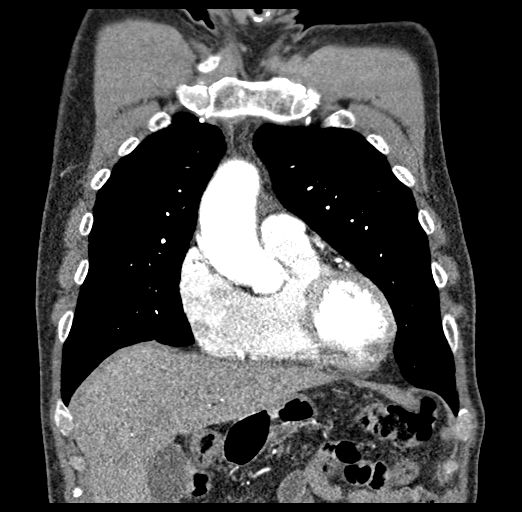
[im 64/127  soft-tissue]
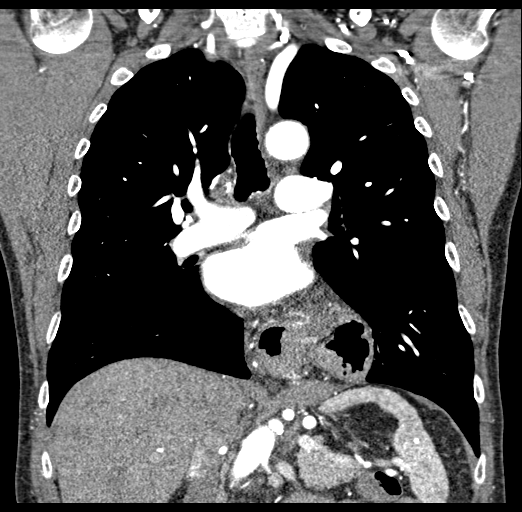
[im 95/127  soft-tissue]
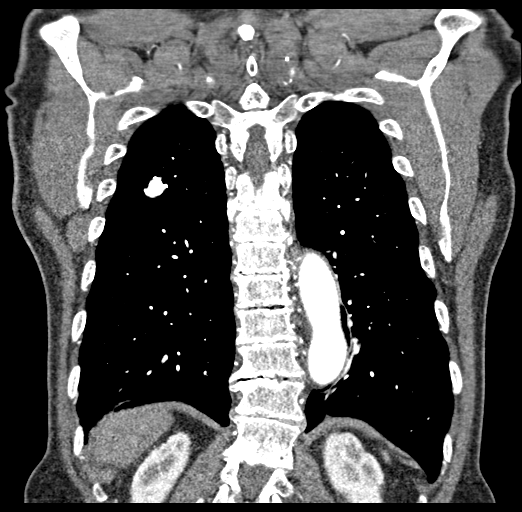

[17 of 46 positions shown; findings below may reference images not displayed]

FINDINGS: Cardiovascular: Ascending thoracic aortic diameter measures 4.3 x
4.2 cm, stable. There is no appreciable thoracic aortic dissection.
Visualized great vessels appear normal. There are foci of
atherosclerotic calcification in the aorta. There are foci of
coronary artery calcification. There is no pericardial effusion or
pericardial thickening. There is no demonstrable pulmonary embolus.

Mediastinum/Nodes: Nodular lesions are noted in the right lobe of
the thyroid. There is a nodular lesion in the lower pole right lobe
measuring 1.5 x 1.3 cm, apparently new. Other smaller lesions are
noted in the right lobe. Left lobe is rather diminutive without
focal lesion evident.

There is no appreciable thoracic adenopathy. There are calcified.
Carinal lymph nodes which are nonenlarged consistent with prior
granulomatous disease.

There is a sizable hiatal hernia with much of the stomach above the
diaphragm. There does not appear to be gastric volvulus.

Lungs/Pleura: There is a stable calcified granuloma in the right
upper lobe. There is no edema or consolidation evident. There is
mild bibasilar atelectasis. No pleural effusion or pleural
thickening evident.

Upper Abdomen: There are calcified granulomas in the spleen.
Visualized upper abdominal structures otherwise appear unremarkable.

Musculoskeletal: There is degenerative change in the thoracic spine.
There are no demonstrable blastic or lytic bone lesions. There is
lower thoracic levoscoliosis.

Review of the MIP images confirms the above findings.
IMPRESSION: 1. Stable ascending thoracic aortic prominence measuring 4.3 x
cm in diameter. Recommend annual imaging followup by CTA or MRA.
This recommendation follows 6353
ACCF/AHA/AATS/ACR/ASA/SCA/MAHAMUD/OUTAM/MORALEZ/GIO Guidelines for the
Diagnosis and Management of Patients with Thoracic Aortic Disease.
Circulation. 6353; 121: [id number]. No evident dissection. There are
foci of atherosclerotic calcification in the aorta. There also foci
of coronary artery calcification.

2.  No demonstrable pulmonary embolus.

3. Sizable hiatal hernia without apparent gastric volvulus. Much of
the stomach is above the diaphragm.

4. Calcified granuloma right upper lobe. Calcified nonenlarged lymph
nodes in the mediastinum. Calcified granulomas in the spleen.

5.  No lung edema or consolidation.  Mild bibasilar atelectasis.

6. **An incidental finding of potential clinical significance has
been found. Dominant mass right lobe thyroid. Consider further
evaluation with thyroid ultrasound. If patient is clinically
hyperthyroid, consider nuclear medicine thyroid uptake and scan.**

7.  No evident pulmonary embolus.

Aortic Atherosclerosis ([id number]-SB4.4).

## 2019-10-27 ENCOUNTER — Telehealth: Payer: Self-pay | Admitting: Cardiology

## 2019-10-27 NOTE — Telephone Encounter (Signed)
Patient stated that he has moved to [place] and has another Film/video editor. Provider and Nurse was informed.
